# Patient Record
Sex: Female | Born: 1935 | Race: White | Hispanic: No | Marital: Single | Smoking: Never smoker
Health system: Southern US, Community
[De-identification: ages and names within clinical notes are randomized; demographics above are authoritative.]

## PROBLEM LIST (undated history)

## (undated) DIAGNOSIS — J302 Other seasonal allergic rhinitis: Secondary | ICD-10-CM

## (undated) DIAGNOSIS — E079 Disorder of thyroid, unspecified: Secondary | ICD-10-CM

## (undated) DIAGNOSIS — J45909 Unspecified asthma, uncomplicated: Secondary | ICD-10-CM

---

## 2002-02-11 ENCOUNTER — Encounter: Admission: RE | Admit: 2002-02-11 | Discharge: 2002-02-22 | Payer: Self-pay | Admitting: Neurology

## 2002-04-25 ENCOUNTER — Ambulatory Visit (HOSPITAL_BASED_OUTPATIENT_CLINIC_OR_DEPARTMENT_OTHER): Admission: RE | Admit: 2002-04-25 | Discharge: 2002-04-25 | Payer: Self-pay | Admitting: Plastic Surgery

## 2008-12-06 ENCOUNTER — Emergency Department (HOSPITAL_COMMUNITY): Admission: EM | Admit: 2008-12-06 | Discharge: 2008-12-06 | Payer: Self-pay | Admitting: Emergency Medicine

## 2011-09-23 LAB — POCT I-STAT, CHEM 8
Chloride: 109 mEq/L (ref 96–112)
Glucose, Bld: 133 mg/dL — ABNORMAL HIGH (ref 70–99)
HCT: 47 % — ABNORMAL HIGH (ref 36.0–46.0)
Hemoglobin: 16 g/dL — ABNORMAL HIGH (ref 12.0–15.0)
Potassium: 3.7 mEq/L (ref 3.5–5.1)

## 2012-10-18 ENCOUNTER — Emergency Department (INDEPENDENT_AMBULATORY_CARE_PROVIDER_SITE_OTHER)
Admission: EM | Admit: 2012-10-18 | Discharge: 2012-10-18 | Disposition: A | Discharge status: Home / Self Care | Payer: Medicare Other | Source: Home / Self Care | Attending: Emergency Medicine | Admitting: Emergency Medicine

## 2012-10-18 ENCOUNTER — Encounter (HOSPITAL_COMMUNITY): Payer: Self-pay | Admitting: Emergency Medicine

## 2012-10-18 DIAGNOSIS — G500 Trigeminal neuralgia: Secondary | ICD-10-CM

## 2012-10-18 HISTORY — DX: Disorder of thyroid, unspecified: E07.9

## 2012-10-18 HISTORY — DX: Unspecified asthma, uncomplicated: J45.909

## 2012-10-18 HISTORY — DX: Other seasonal allergic rhinitis: J30.2

## 2012-10-18 MED ORDER — TEGRETOL 200 MG PO TABS: 200.0000 mg | ORAL_TABLET | Freq: Two times a day (BID) | ORAL | Status: DC

## 2012-10-18 NOTE — ED Notes (Signed)
Ear pain since Tuesday 

## 2012-10-18 NOTE — ED Provider Notes (Signed)
Chief Complaint  Patient presents with  . Otalgia    History of Present Illness:   Vicki Livingston is an 78 year old female who presents today with left parietal head pain which has been going on for the past 3 days. The pain seems to originate behind the left ear and shoots to the left parietal area. She describes this as a sharp, stabbing, shooting pain lasting for just a second or 2. This will recur every 5-10 minutes.. Also feels that her ear is blocked up. She denies any drainage from the ear canal or difficulty hearing. She has had no rash in the area and denies any head injury or fall. She has had no diplopia, blurred vision or any other ocular symptoms. She denies nasal congestion, rhinorrhea, sore throat, or oral lesions. She's had no cervical pain, adenopathy, or swelling. She denies any paresthesias, muscle weakness, or difficulty with speech, swallowing, or ambulation. She's never had headaches before and has been remarkably healthy. She states she only sees a doctor about once a year for physical. She takes Synthroid, Premarin, and some allergy meds.  Review of Systems:  Other than noted above, the patient denies any of the following symptoms: Systemic:  No fever, chills, fatigue, photophobia, stiff neck. Eye:  No redness, eye pain, discharge, blurred vision, or diplopia. ENT:  No nasal congestion, rhinorrhea, sinus pressure or pain, sneezing, earache, or sore throat.  No jaw claudication. Neuro:  No paresthesias, loss of consciousness, seizure activity, muscle weakness, trouble with coordination or gait, trouble speaking or swallowing. Psych:  No depression, anxiety or trouble sleeping.  PMFSH:  Past medical history, family history, social history, meds, and allergies were reviewed.  Physical Exam:   Vital signs:  BP 140/81  Pulse 70  Temp 98.1 F (36.7 C) (Oral)  Resp 18  SpO2 94% General:  Alert and oriented.  In no distress. Eye:  Lids and conjunctivas normal.  PERRL at 3 mm  bilaterally,  Full EOMs.  Fundi benign with normal discs and vessels. ENT:  No cranial or facial tenderness to palpation.  TMs and canals clear.  Nasal mucosa was normal and uncongested without any drainage. No intra oral lesions, pharynx clear, mucous membranes moist, dentition normal. Neck:  Supple, full ROM, no tenderness to palpation.  No adenopathy or mass. Neuro:  Alert and orented times 3.  Speech was clear, fluent, and appropriate.  Cranial nerves intact. No pronator drift, muscle strength normal. Finger to nose normal.  DTRs were 2+ and symmetrical.Station and gait were normal.  Romberg's sign was normal.  Able to perform tandem gait well. Psych:  Normal affect.  Assessment:  The encounter diagnosis was Tic douloureux.  Plan:   1.  The following meds were prescribed:   New Prescriptions   TEGRETOL 200 MG TABLET    Take 1 tablet (200 mg total) by mouth 2 (two) times daily.   2.  The patient was instructed in symptomatic care and handouts were given. The diagnoses and cause of the pain was outlined with the patient. I suggested she followup with a neurologist as soon as possible and she was given the name of Dr. Porfirio Mylar Dohmeier. Side effects of Tegretol were outlined with her. If this is tic douloureux, the symptoms should go way fairly quickly. 3.  The patient was told to return if becoming worse in any way, if no better in 3 or 4 days, and given some red flag symptoms that would indicate earlier return.    Dineen Kid  Lorenz Coaster, MD 10/18/12 1327

## 2013-12-08 ENCOUNTER — Encounter (HOSPITAL_COMMUNITY): Payer: Self-pay | Admitting: Emergency Medicine

## 2013-12-08 ENCOUNTER — Emergency Department (HOSPITAL_COMMUNITY): Payer: Medicare Other

## 2013-12-08 ENCOUNTER — Emergency Department (INDEPENDENT_AMBULATORY_CARE_PROVIDER_SITE_OTHER)
Admission: EM | Admit: 2013-12-08 | Discharge: 2013-12-08 | Disposition: A | Discharge status: Nursing Home (Includes ICF) | Payer: Medicare Other | Source: Home / Self Care

## 2013-12-08 ENCOUNTER — Emergency Department (HOSPITAL_COMMUNITY)
Admission: EM | Admit: 2013-12-08 | Discharge: 2013-12-08 | Disposition: A | Discharge status: Home / Self Care | Payer: Medicare Other | Attending: Emergency Medicine | Admitting: Emergency Medicine

## 2013-12-08 DIAGNOSIS — R2990 Unspecified symptoms and signs involving the nervous system: Secondary | ICD-10-CM

## 2013-12-08 DIAGNOSIS — R519 Headache, unspecified: Secondary | ICD-10-CM

## 2013-12-08 DIAGNOSIS — J45909 Unspecified asthma, uncomplicated: Secondary | ICD-10-CM | POA: Insufficient documentation

## 2013-12-08 DIAGNOSIS — R51 Headache: Secondary | ICD-10-CM

## 2013-12-08 DIAGNOSIS — R29818 Other symptoms and signs involving the nervous system: Secondary | ICD-10-CM

## 2013-12-08 DIAGNOSIS — Z79899 Other long term (current) drug therapy: Secondary | ICD-10-CM | POA: Insufficient documentation

## 2013-12-08 DIAGNOSIS — E079 Disorder of thyroid, unspecified: Secondary | ICD-10-CM | POA: Insufficient documentation

## 2013-12-08 DIAGNOSIS — M531 Cervicobrachial syndrome: Secondary | ICD-10-CM | POA: Insufficient documentation

## 2013-12-08 DIAGNOSIS — M5481 Occipital neuralgia: Secondary | ICD-10-CM

## 2013-12-08 LAB — POCT I-STAT, CHEM 8
BUN: 21 mg/dL (ref 6–23)
Calcium, Ion: 1.17 mmol/L (ref 1.13–1.30)
Chloride: 104 mEq/L (ref 96–112)
HCT: 43 % (ref 36.0–46.0)
Hemoglobin: 14.6 g/dL (ref 12.0–15.0)
Potassium: 5.1 mEq/L (ref 3.5–5.1)
Sodium: 137 mEq/L (ref 135–145)

## 2013-12-08 LAB — CBC
HCT: 38.1 % (ref 36.0–46.0)
MCV: 86.8 fL (ref 78.0–100.0)
Platelets: 279 10*3/uL (ref 150–400)
RBC: 4.39 MIL/uL (ref 3.87–5.11)
RDW: 14 % (ref 11.5–15.5)
WBC: 7.1 10*3/uL (ref 4.0–10.5)

## 2013-12-08 MED ORDER — CARBAMAZEPINE 200 MG PO TABS: 200.0000 mg | ORAL_TABLET | Freq: Three times a day (TID) | ORAL | Status: DC

## 2013-12-08 MED ORDER — TRAMADOL HCL 50 MG PO TABS: 50.0000 mg | ORAL_TABLET | Freq: Four times a day (QID) | ORAL | Status: DC | PRN

## 2013-12-08 NOTE — ED Provider Notes (Signed)
CSN: [id number]     Arrival date & time 12/08/13  1029 History   None    Chief Complaint  Patient presents with  . Headache   (Consider location/radiation/quality/duration/timing/severity/associated sxs/prior Treatment) HPI Comments: 79 year old female presents complaining of severe pain in her head. She began having this pain on Wednesday. She has frequent sharp stabbing pains that originates in the right posterior superior neck and shoots up the back of her head. They last for a few seconds and then resolve. The pain is 10 out of 10. This happens every couple of minutes. She had this once approximately 15 years ago and was found to have MRI evidence of an old stroke at that time. He has a remote history of headaches and migraines and she says it is nothing like what she is feeling right now.  Patient is a 79 y.o. female presenting with headaches.  Headache Associated symptoms: no abdominal pain, no cough, no dizziness, no fever, no myalgias, no nausea and no vomiting     Past Medical History  Diagnosis Date  . Seasonal allergies   . Thyroid disease   . Asthma    History reviewed. No pertinent past surgical history. No family history on file. History  Substance Use Topics  . Smoking status: Never Smoker   . Smokeless tobacco: Not on file  . Alcohol Use: No   OB History   Grav Para Term Preterm Abortions TAB SAB Ect Mult Living                 Review of Systems  Constitutional: Negative for fever and chills.  Eyes: Negative for visual disturbance.  Respiratory: Negative for cough and shortness of breath.   Cardiovascular: Negative for chest pain, palpitations and leg swelling.  Gastrointestinal: Negative for nausea, vomiting and abdominal pain.  Endocrine: Negative for polydipsia and polyuria.  Genitourinary: Negative for dysuria, urgency and frequency.  Musculoskeletal: Negative for arthralgias and myalgias.  Skin: Negative for rash.  Neurological: Positive for  headaches. Negative for dizziness, weakness and light-headedness.    Allergies  Augmentin; Erythromycin; Levaquin; Macrolides and ketolides; and Sulfa antibiotics  Home Medications   Current Outpatient Rx  Name  Route  Sig  Dispense  Refill  . cetirizine (ZYRTEC) 10 MG tablet   Oral   Take 10 mg by mouth daily.         Marland Kitchen estrogens, conjugated, (PREMARIN) 0.625 MG tablet   Oral   Take 0.625 mg by mouth daily. Take daily for 21 days then do not take for 7 days.         Marland Kitchen levothyroxine (SYNTHROID, LEVOTHROID) 50 MCG tablet   Oral   Take 50 mcg by mouth daily.         . montelukast (SINGULAIR) 10 MG tablet   Oral   Take 10 mg by mouth at bedtime.         . TEGRETOL 200 MG tablet   Oral   Take 1 tablet (200 mg total) by mouth 2 (two) times daily.   60 tablet   0     Dispense as written.    BP 174/96  Pulse 81  Temp(Src) 98.4 F (36.9 C) (Oral)  Resp 18  SpO2 98% Physical Exam  Nursing note and vitals reviewed. Constitutional: She is oriented to person, place, and time. Vital signs are normal. She appears well-developed and well-nourished. No distress.  HENT:  Head: Normocephalic and atraumatic.  Pulmonary/Chest: Effort normal. No respiratory distress.  Neurological: She is alert and oriented to person, place, and time. She has normal strength. A cranial nerve deficit is present. Coordination normal.  EOM weakness with superior left lateral gaze   Skin: Skin is warm and dry. No rash noted. She is not diaphoretic.  Psychiatric: She has a normal mood and affect. Judgment normal.    ED Course  Procedures (including critical care time) Labs Review Labs Reviewed - No data to display Imaging Review No results found.    MDM   1. Headache   2. Abnormal neurological exam    Transferred to ED for eval of severe headaches, r/o CVA, r/o intracranial abnormality     Graylon Good, PA-C 12/08/13 1101

## 2013-12-08 NOTE — ED Provider Notes (Signed)
CSN: [id number]     Arrival date & time 12/08/13  1111 History   First MD Initiated Contact with Patient 12/08/13 1129     Chief Complaint  Patient presents with  . Headache   (Consider location/radiation/quality/duration/timing/severity/associated sxs/prior Treatment) HPI Comments: Patient presents to the ER for evaluation of headaches. Patient reports that she has been experiencing sharp, stabbing pains in the right occipital area of her head for the last 4 days. Symptoms have been intermittently. In between episodes, she is completely normal. She reports that she will have a stabbing pain that lasts for seconds and then resolves. She does have a history of intermittent headaches and migraines, this feels different. She reports that it doesn't feel like a migraine, cannot describe what she is experiencing. No fever, neck pain. Pain does not occur with movement of the hand. She has not identified any exacerbating or alleviating factors.  Patient is a 79 y.o. female presenting with headaches.  Headache   Past Medical History  Diagnosis Date  . Seasonal allergies   . Thyroid disease   . Asthma    History reviewed. No pertinent past surgical history. No family history on file. History  Substance Use Topics  . Smoking status: Never Smoker   . Smokeless tobacco: Not on file  . Alcohol Use: No   OB History   Grav Para Term Preterm Abortions TAB SAB Ect Mult Living                 Review of Systems  Neurological: Positive for headaches.  All other systems reviewed and are negative.    Allergies  Augmentin; Erythromycin; Levaquin; Macrolides and ketolides; and Sulfa antibiotics  Home Medications   Current Outpatient Rx  Name  Route  Sig  Dispense  Refill  . cetirizine (ZYRTEC) 10 MG tablet   Oral   Take 10 mg by mouth daily.         Marland Kitchen estrogens, conjugated, (PREMARIN) 0.625 MG tablet   Oral   Take 0.625 mg by mouth daily. Take daily for 21 days then do not take for 7  days.         Marland Kitchen levothyroxine (SYNTHROID, LEVOTHROID) 50 MCG tablet   Oral   Take 50 mcg by mouth daily.         . montelukast (SINGULAIR) 10 MG tablet   Oral   Take 10 mg by mouth at bedtime.         . TEGRETOL 200 MG tablet   Oral   Take 1 tablet (200 mg total) by mouth 2 (two) times daily.   60 tablet   0     Dispense as written.    BP 145/75  Pulse 77  Temp(Src) 97.5 F (36.4 C) (Oral)  Resp 16  Wt 135 lb 14.4 oz (61.644 kg)  SpO2 98% Physical Exam  Constitutional: She is oriented to person, place, and time. She appears well-developed and well-nourished. No distress.  HENT:  Head: Normocephalic and atraumatic.  Right Ear: Hearing normal.  Left Ear: Hearing normal.  Nose: Nose normal.  Mouth/Throat: Oropharynx is clear and moist and mucous membranes are normal.  Eyes: Conjunctivae and EOM are normal. Pupils are equal, round, and reactive to light.  Neck: Normal range of motion. Neck supple.  Cardiovascular: Regular rhythm, S1 normal and S2 normal.  Exam reveals no gallop and no friction rub.   No murmur heard. Pulmonary/Chest: Effort normal and breath sounds normal. No respiratory distress. She exhibits no tenderness.  Abdominal: Soft. Normal appearance and bowel sounds are normal. There is no hepatosplenomegaly. There is no tenderness. There is no rebound, no guarding, no tenderness at McBurney's point and negative Murphy's sign. No hernia.  Musculoskeletal: Normal range of motion.  Neurological: She is alert and oriented to person, place, and time. She has normal strength. No cranial nerve deficit or sensory deficit. Coordination normal. GCS eye subscore is 4. GCS verbal subscore is 5. GCS motor subscore is 6.  Skin: Skin is warm, dry and intact. No rash noted. No cyanosis.  Psychiatric: She has a normal mood and affect. Her speech is normal and behavior is normal. Thought content normal.    ED Course  Procedures (including critical care time) Labs  Review Labs Reviewed  CBC   Imaging Review No results found.  EKG Interpretation   None       MDM  Diagnosis: Occipital neuralgia  Patient presents to ER for evaluation of paroxysmal, sharp, stabbing pains over the distribution of the occipital nerve on the right side for several days. In between episodes, patient is entirely symptom-free. She does have a history of headaches and migraines, but this is different. Workup was unremarkable including CT of head. She has a normal neurologic exam. She was referred to the ER by her care because they were concerned about possible cranial nerve abnormality. My examination here is normal, I do not see any evidence of cranial nerve palsy or abnormality of extraocular muscle movements. Pain she is experiencing would not be present in any pathology that would cause cranial nerve palsy.  As her workup is entirely normal, patient will be treated empirically for occipital neuralgia, refer to urology. Return to the ER if symptoms worsen.    Gilda Crease, MD 12/08/13 1308

## 2013-12-08 NOTE — ED Notes (Signed)
Pt c/o severe head pain onset Wednesday. Pt seen at urgent care and sent here for further eval. Pt describes pain as a feeling of lightening. Pain is intermittent. Pt tried tylenol without relief. Pt denies weakness. Pt with equal grips B/L, speech clear, no facial droop. Pt has history of stroke.

## 2013-12-08 NOTE — ED Notes (Signed)
Patient transported to CT 

## 2013-12-08 NOTE — ED Notes (Signed)
Patient returned from CT

## 2013-12-08 NOTE — ED Notes (Signed)
79 yr old is here today with complaints of a headache for 4 dys. Pt does have Hx of Stroke. Denies: SOB, chest pain

## 2013-12-09 NOTE — ED Provider Notes (Signed)
Medical screening examination/treatment/procedure(s) were performed by a resident physician or non-physician practitioner and as the supervising physician I was immediately available for consultation/collaboration.  Evan Corey, MD    Evan S Corey, MD 12/09/13 0749 

## 2013-12-14 ENCOUNTER — Emergency Department (HOSPITAL_COMMUNITY): Payer: Medicare Other

## 2013-12-14 ENCOUNTER — Emergency Department (HOSPITAL_COMMUNITY)
Admission: EM | Admit: 2013-12-14 | Discharge: 2013-12-14 | Disposition: A | Discharge status: Home / Self Care | Payer: Medicare Other | Attending: Emergency Medicine | Admitting: Emergency Medicine

## 2013-12-14 ENCOUNTER — Encounter (HOSPITAL_COMMUNITY): Payer: Self-pay | Admitting: Emergency Medicine

## 2013-12-14 DIAGNOSIS — Z79899 Other long term (current) drug therapy: Secondary | ICD-10-CM | POA: Insufficient documentation

## 2013-12-14 DIAGNOSIS — S20212A Contusion of left front wall of thorax, initial encounter: Secondary | ICD-10-CM

## 2013-12-14 DIAGNOSIS — Z880 Allergy status to penicillin: Secondary | ICD-10-CM | POA: Insufficient documentation

## 2013-12-14 DIAGNOSIS — Y939 Activity, unspecified: Secondary | ICD-10-CM | POA: Insufficient documentation

## 2013-12-14 DIAGNOSIS — Z791 Long term (current) use of non-steroidal anti-inflammatories (NSAID): Secondary | ICD-10-CM | POA: Insufficient documentation

## 2013-12-14 DIAGNOSIS — IMO0002 Reserved for concepts with insufficient information to code with codable children: Secondary | ICD-10-CM | POA: Insufficient documentation

## 2013-12-14 DIAGNOSIS — W19XXXA Unspecified fall, initial encounter: Secondary | ICD-10-CM

## 2013-12-14 DIAGNOSIS — W1809XA Striking against other object with subsequent fall, initial encounter: Secondary | ICD-10-CM | POA: Insufficient documentation

## 2013-12-14 DIAGNOSIS — E079 Disorder of thyroid, unspecified: Secondary | ICD-10-CM | POA: Insufficient documentation

## 2013-12-14 DIAGNOSIS — Y92009 Unspecified place in unspecified non-institutional (private) residence as the place of occurrence of the external cause: Secondary | ICD-10-CM | POA: Insufficient documentation

## 2013-12-14 DIAGNOSIS — S20219A Contusion of unspecified front wall of thorax, initial encounter: Secondary | ICD-10-CM | POA: Insufficient documentation

## 2013-12-14 DIAGNOSIS — J45909 Unspecified asthma, uncomplicated: Secondary | ICD-10-CM | POA: Insufficient documentation

## 2013-12-14 LAB — URINALYSIS, ROUTINE W REFLEX MICROSCOPIC
Glucose, UA: NEGATIVE mg/dL
Ketones, ur: 15 mg/dL — AB
Leukocytes, UA: NEGATIVE
Nitrite: NEGATIVE
Specific Gravity, Urine: 1.017 (ref 1.005–1.030)
pH: 6 (ref 5.0–8.0)

## 2013-12-14 MED ORDER — HYDROCODONE-ACETAMINOPHEN 5-325 MG PO TABS: 1.0000 | ORAL_TABLET | Freq: Four times a day (QID) | ORAL | Status: AC | PRN

## 2013-12-14 MED ORDER — ONDANSETRON HCL 4 MG PO TABS: 4.0000 mg | ORAL_TABLET | Freq: Four times a day (QID) | ORAL | Status: AC

## 2013-12-14 MED ORDER — HYDROCODONE-ACETAMINOPHEN 5-325 MG PO TABS
1.0000 | ORAL_TABLET | Freq: Once | ORAL | Status: DC
  Filled 2013-12-14: qty 1

## 2013-12-14 NOTE — ED Provider Notes (Signed)
Medical screening examination/treatment/procedure(s) were conducted as a shared visit with non-physician practitioner(s) and myself.  I personally evaluated the patient during the encounter.  EKG Interpretation   None      Tender left lateral lower ribs abd:SNT left hip NT  Hurman Horn, MD 12/16/13 2032

## 2013-12-14 NOTE — ED Notes (Addendum)
Pt states she thinks her feet got caught on the carpet and she fell.  Pt c/o L rib pain.  Pt denies striking head.

## 2013-12-14 NOTE — ED Provider Notes (Signed)
CSN: [id number]     Arrival date & time 12/14/13  1413 History  This chart was scribed for non-physician practitioner Allean Found, PA-C working with Hurman Horn, MD by Valera Castle, ED scribe. This patient was seen in room TR09C/TR09C and the patient's care was started at 4:55 PM.   Chief Complaint  Patient presents with  . Fall   HPI HPI Comments: Vicki Livingston is a 80 y.o. female who presents to the Emergency Department complaining of a fall, onset 3 days ago, when she fell backwards onto a plastic bottle with a large cap. She reports sudden, moderate, constant, upper, left sided back pain, that radiates to her left rib, from the fall. She denies LOC, head trauma. She reports pain with movement, including when she attempts to lay down. She reports she has had Vicodin in the past for pain. She denies middle back pain, abdominal pain, hip pain, dizziness, light-headedness, nausea, hematuria, and any other associated symptoms. Pt denies any pertinent medical history.   PCP - No primary provider on file.  Past Medical History  Diagnosis Date  . Seasonal allergies   . Thyroid disease   . Asthma    History reviewed. No pertinent past surgical history. History reviewed. No pertinent family history. History  Substance Use Topics  . Smoking status: Never Smoker   . Smokeless tobacco: Not on file  . Alcohol Use: No   OB History   Grav Para Term Preterm Abortions TAB SAB Ect Mult Living                 Review of Systems  Gastrointestinal: Negative for nausea and abdominal pain.  Genitourinary: Negative for hematuria.  Musculoskeletal: Positive for arthralgias (left rib) and back pain (left, upper). Negative for gait problem.  Neurological: Negative for dizziness, syncope and light-headedness.  All other systems reviewed and are negative.    Allergies  Augmentin; Erythromycin; Levaquin; Macrolides and ketolides; and Sulfa antibiotics  Home Medications   Current Outpatient Rx   Name  Route  Sig  Dispense  Refill  . acetaminophen (TYLENOL) 500 MG tablet   Oral   Take 1,000 mg by mouth every 6 (six) hours as needed for mild pain.         . carbamazepine (TEGRETOL) 200 MG tablet   Oral   Take 1 tablet (200 mg total) by mouth 3 (three) times daily. Take one half a tablet twice a day for two days, then take one tablet twice a day for two days, then take 2 tablets a day after that   30 tablet   2   . cetirizine (ZYRTEC) 10 MG tablet   Oral   Take 10 mg by mouth daily.         Marland Kitchen estrogens, conjugated, (PREMARIN) 0.625 MG tablet   Oral   Take 0.625 mg by mouth daily. Take daily for 21 days then do not take for 7 days.         Marland Kitchen ibuprofen (ADVIL,MOTRIN) 200 MG tablet   Oral   Take 200 mg by mouth every 6 (six) hours as needed for moderate pain.         Marland Kitchen levothyroxine (SYNTHROID, LEVOTHROID) 50 MCG tablet   Oral   Take 50 mcg by mouth daily.         . montelukast (SINGULAIR) 10 MG tablet   Oral   Take 10 mg by mouth at bedtime.         . naproxen sodium (  ANAPROX) 220 MG tablet   Oral   Take 220 mg by mouth 2 (two) times daily as needed (for pain).         . traMADol (ULTRAM) 50 MG tablet   Oral   Take 1 tablet (50 mg total) by mouth every 6 (six) hours as needed.   15 tablet   0    BP 170/81  Pulse 82  Temp(Src) 97.5 F (36.4 C) (Oral)  Resp 16  Ht 5\' 4"  (1.626 m)  Wt 134 lb 6.4 oz (60.963 kg)  BMI 23.06 kg/m2  SpO2 95%  Physical Exam  Nursing note and vitals reviewed. Constitutional: She is oriented to person, place, and time. She appears well-developed and well-nourished. No distress.  HENT:  Head: Normocephalic and atraumatic.  Eyes: EOM are normal.  Neck: Neck supple. No tracheal deviation present.  Cardiovascular: Normal rate.   Pulmonary/Chest: Effort normal. No respiratory distress.  Musculoskeletal: Normal range of motion.  Neurological: She is alert and oriented to person, place, and time.  Skin: Skin is warm  and dry.  Psychiatric: She has a normal mood and affect. Her behavior is normal.    ED Course  Procedures (including critical care time)  DIAGNOSTIC STUDIES: Oxygen Saturation is 95% on room air, normal by my interpretation.    COORDINATION OF CARE: 5:00 PM-Discussed treatment plan which includes imaging results and UA with pt at bedside and pt agreed to plan.   Labs Review Labs Reviewed - No data to display Imaging Review Dg Ribs Unilateral W/chest Left  12/14/2013   CLINICAL DATA:  79 year old who fell 3 days ago and injured the left posterior lower ribs.  EXAM: LEFT RIBS AND CHEST - 3+ VIEW  COMPARISON:  None.  FINDINGS: No fractures identified involving the left ribs. Osseous demineralization. Prominent costal cartilage calcification.  Cardiac silhouette normal in size. Thoracic aorta mildly atherosclerotic. Hilar and mediastinal contours otherwise unremarkable. Lungs clear. Bronchovascular markings normal. Pulmonary vascularity normal. No visible pleural effusions. No pneumothorax. Mild elevation of the right hemidiaphragm.  IMPRESSION: 1. No left rib fractures identified. 2. No acute cardiopulmonary disease.   Electronically Signed   By: Hulan Saas M.D.   On: 12/14/2013 15:44    EKG Interpretation   None       MDM  No diagnosis found. 1. Rib contusion 2. Fall  Urine negative for hemoglobin. No abdominal tenderness - doubt splenic injury. X-ray reassuring for no pulmonary injury. Patient stable for discharge.      I personally performed the services described in this documentation, which was scribed in my presence. The recorded information has been reviewed and is accurate.     Arnoldo Hooker, PA-C 12/14/13 [phone]

## 2013-12-14 NOTE — ED Notes (Signed)
Vicki Beam, PA notified that pt will not take generic meds because she states they make her vomit.  Pharmacy called to inquire if we had non-generic Norco or Tramadol.  We only have generic.

## 2013-12-14 NOTE — ED Notes (Signed)
Per pt sts fell on [place] and hit left upper back area and is in pain. Denies LOC or hitting head. Pt points to left rib area.

## 2014-04-28 ENCOUNTER — Encounter (HOSPITAL_COMMUNITY): Payer: Self-pay | Admitting: Emergency Medicine

## 2014-04-28 ENCOUNTER — Emergency Department (INDEPENDENT_AMBULATORY_CARE_PROVIDER_SITE_OTHER)
Admission: EM | Admit: 2014-04-28 | Discharge: 2014-04-28 | Disposition: A | Discharge status: Home / Self Care | Payer: Medicare Other | Source: Home / Self Care | Attending: Family Medicine | Admitting: Family Medicine

## 2014-04-28 DIAGNOSIS — N390 Urinary tract infection, site not specified: Secondary | ICD-10-CM

## 2014-04-28 LAB — POCT URINALYSIS DIP (DEVICE)
BILIRUBIN URINE: NEGATIVE
Glucose, UA: NEGATIVE mg/dL
HGB URINE DIPSTICK: NEGATIVE
Nitrite: NEGATIVE
PH: 6.5 (ref 5.0–8.0)
Protein, ur: NEGATIVE mg/dL
Specific Gravity, Urine: 1.01 (ref 1.005–1.030)
UROBILINOGEN UA: 0.2 mg/dL (ref 0.0–1.0)

## 2014-04-28 MED ORDER — SUPRAX 400 MG PO CAPS: 400.0000 mg | ORAL_CAPSULE | Freq: Every day | ORAL | Status: AC

## 2014-04-28 NOTE — ED Provider Notes (Signed)
Medical screening examination/treatment/procedure(s) were performed by resident physician or non-physician practitioner and as supervising physician I was immediately available for consultation/collaboration.   Yamel Bale DOUGLAS MD.   Surina Storts D Puanani Gene, MD 04/28/14 2158 

## 2014-04-28 NOTE — Discharge Instructions (Signed)
Urinary Tract Infection  Urinary tract infections (UTIs) can develop anywhere along your urinary tract. Your urinary tract is your body's drainage system for removing wastes and extra water. Your urinary tract includes two kidneys, two ureters, a bladder, and a urethra. Your kidneys are a pair of bean-shaped organs. Each kidney is about the size of your fist. They are located below your ribs, one on each side of your spine.  CAUSES  Infections are caused by microbes, which are microscopic organisms, including fungi, viruses, and bacteria. These organisms are so small that they can only be seen through a microscope. Bacteria are the microbes that most commonly cause UTIs.  SYMPTOMS   Symptoms of UTIs may vary by age and gender of the patient and by the location of the infection. Symptoms in young women typically include a frequent and intense urge to urinate and a painful, burning feeling in the bladder or urethra during urination. Older women and men are more likely to be tired, shaky, and weak and have muscle aches and abdominal pain. A fever may mean the infection is in your kidneys. Other symptoms of a kidney infection include pain in your back or sides below the ribs, nausea, and vomiting.  DIAGNOSIS  To diagnose a UTI, your caregiver will ask you about your symptoms. Your caregiver also will ask to provide a urine sample. The urine sample will be tested for bacteria and white blood cells. White blood cells are made by your body to help fight infection.  TREATMENT   Typically, UTIs can be treated with medication. Because most UTIs are caused by a bacterial infection, they usually can be treated with the use of antibiotics. The choice of antibiotic and length of treatment depend on your symptoms and the type of bacteria causing your infection.  HOME CARE INSTRUCTIONS   If you were prescribed antibiotics, take them exactly as your caregiver instructs you. Finish the medication even if you feel better after you  have only taken some of the medication.   Drink enough water and fluids to keep your urine clear or pale yellow.   Avoid caffeine, tea, and carbonated beverages. They tend to irritate your bladder.   Empty your bladder often. Avoid holding urine for long periods of time.   Empty your bladder before and after sexual intercourse.   After a bowel movement, women should cleanse from front to back. Use each tissue only once.  SEEK MEDICAL CARE IF:    You have back pain.   You develop a fever.   Your symptoms do not begin to resolve within 3 days.  SEEK IMMEDIATE MEDICAL CARE IF:    You have severe back pain or lower abdominal pain.   You develop chills.   You have nausea or vomiting.   You have continued burning or discomfort with urination.  MAKE SURE YOU:    Understand these instructions.   Will watch your condition.   Will get help right away if you are not doing well or get worse.  Document Released: 09/14/2005 Document Revised: 06/05/2012 Document Reviewed: 01/13/2012  [employer] Patient Information 2014 [employer]

## 2014-04-28 NOTE — ED Provider Notes (Signed)
CSN: [id number]     Arrival date & time 04/28/14  1619 History   None    Chief Complaint  Patient presents with  . Urinary Tract Infection   (Consider location/radiation/quality/duration/timing/severity/associated sxs/prior Treatment) HPI Comments: 80 year old female presents complaining of possible urinary tract infection. For a few days she has burning with urination, urinary frequency, and just today some lower abdominal pain radiating to the lower back with urination. Her symptoms have gotten slightly better with over-the-counter AZO but overall had been getting persistently worse. She denies any fever, chills, NVD, flank pain. No recent hospitalizations or catheterizations   Past Medical History  Diagnosis Date  . Seasonal allergies   . Thyroid disease   . Asthma    History reviewed. No pertinent past surgical history. History reviewed. No pertinent family history. History  Substance Use Topics  . Smoking status: Never Smoker   . Smokeless tobacco: Not on file  . Alcohol Use: No   OB History   Grav Para Term Preterm Abortions TAB SAB Ect Mult Living                 Review of Systems  Genitourinary: Positive for dysuria, urgency and frequency. Negative for flank pain and vaginal discharge.  All other systems reviewed and are negative.   Allergies  Augmentin; Erythromycin; Levaquin; Macrolides and ketolides; and Sulfa antibiotics  Home Medications   Prior to Admission medications   Medication Sig Start Date End Date Taking? Authorizing Provider  cetirizine (ZYRTEC) 10 MG tablet Take 10 mg by mouth daily.    Historical Provider, MD  estrogens, conjugated, (PREMARIN) 0.625 MG tablet Take 0.625 mg by mouth daily. Take daily for 21 days then do not take for 7 days.    Historical Provider, MD  HYDROcodone-acetaminophen (NORCO) 5-325 MG per tablet Take 1 tablet by mouth every 6 (six) hours as needed for moderate pain. 12/14/13   Shari A Upstill, PA-C  ibuprofen  (ADVIL,MOTRIN) 200 MG tablet Take 200 mg by mouth every 6 (six) hours as needed for moderate pain.    Historical Provider, MD  levothyroxine (SYNTHROID, LEVOTHROID) 50 MCG tablet Take 50 mcg by mouth daily.    Historical Provider, MD  montelukast (SINGULAIR) 10 MG tablet Take 10 mg by mouth at bedtime.    Historical Provider, MD  ondansetron (ZOFRAN) 4 MG tablet Take 1 tablet (4 mg total) by mouth every 6 (six) hours. 12/14/13   Shari A Upstill, PA-C   BP 158/79  Pulse 90  Temp(Src) 98.1 F (36.7 C) (Oral)  Resp 16  SpO2 98% Physical Exam  Nursing note and vitals reviewed. Constitutional: She is oriented to person, place, and time. Vital signs are normal. She appears well-developed and well-nourished. No distress.  HENT:  Head: Normocephalic and atraumatic.  Pulmonary/Chest: Effort normal. No respiratory distress.  Abdominal: Soft. She exhibits no mass. There is no tenderness. There is no rebound, no guarding and no CVA tenderness.  Neurological: She is alert and oriented to person, place, and time. She has normal strength. Coordination normal.  Skin: Skin is warm and dry. No rash noted. She is not diaphoretic.  Psychiatric: She has a normal mood and affect. Judgment normal.    ED Course  Procedures (including critical care time) Labs Review Labs Reviewed  POCT URINALYSIS DIP (DEVICE) - Abnormal; Notable for the following:    Ketones, ur TRACE (*)    Leukocytes, UA TRACE (*)    All other components within normal limits  URINE CULTURE  Imaging Review No results found.   MDM   1. UTI (lower urinary tract infection)    She requests name brand treatment only.  She has many drug allergies.  Will treat with suprax.  Push fluids.  F/u PRN.  Culture sent    Meds ordered this encounter  Medications  . SUPRAX 400 MG CAPS capsule    Sig: Take 1 capsule (400 mg total) by mouth daily.    Dispense:  7 capsule    Refill:  0    Order Specific Question:  Supervising Provider     Answer:  Clementeen GrahamOREY, EVAN, Kathie RhodesS [[id number]]       Graylon GoodZachary H Idara Woodside, PA-C 04/28/14 [id number]

## 2014-04-28 NOTE — ED Notes (Signed)
C/o  Urinary urgency and frequency.  Dysuria.  X 1 wk.  Mild relief with AZO.

## 2014-05-05 ENCOUNTER — Telehealth (HOSPITAL_COMMUNITY): Payer: Self-pay | Admitting: *Deleted

## 2014-05-05 NOTE — ED Notes (Signed)
Urine culture was cancelled because the specimen was not received.  Pt. treated with Suprax. 5/14 Message sent to Borders Groupach Baker PA.  He wrote to call pt. for clinical improvement.  5/18 I called pt. and she said no pain or problem urinating. I told her to come back if not better after finishing her medication. Pt. voiced understanding. Vicki Livingston 05/05/2014

## 2014-05-23 IMAGING — CR DG RIBS W/ CHEST 3+V*L*
5 series · 5 of 5 positions shown · non-contrast
Comparison: None.

CLINICAL DATA: 83-year-old who fell 3 days ago and injured the left
posterior lower ribs.

EXAM:
LEFT RIBS AND CHEST - 3+ VIEW

[w chest pa]
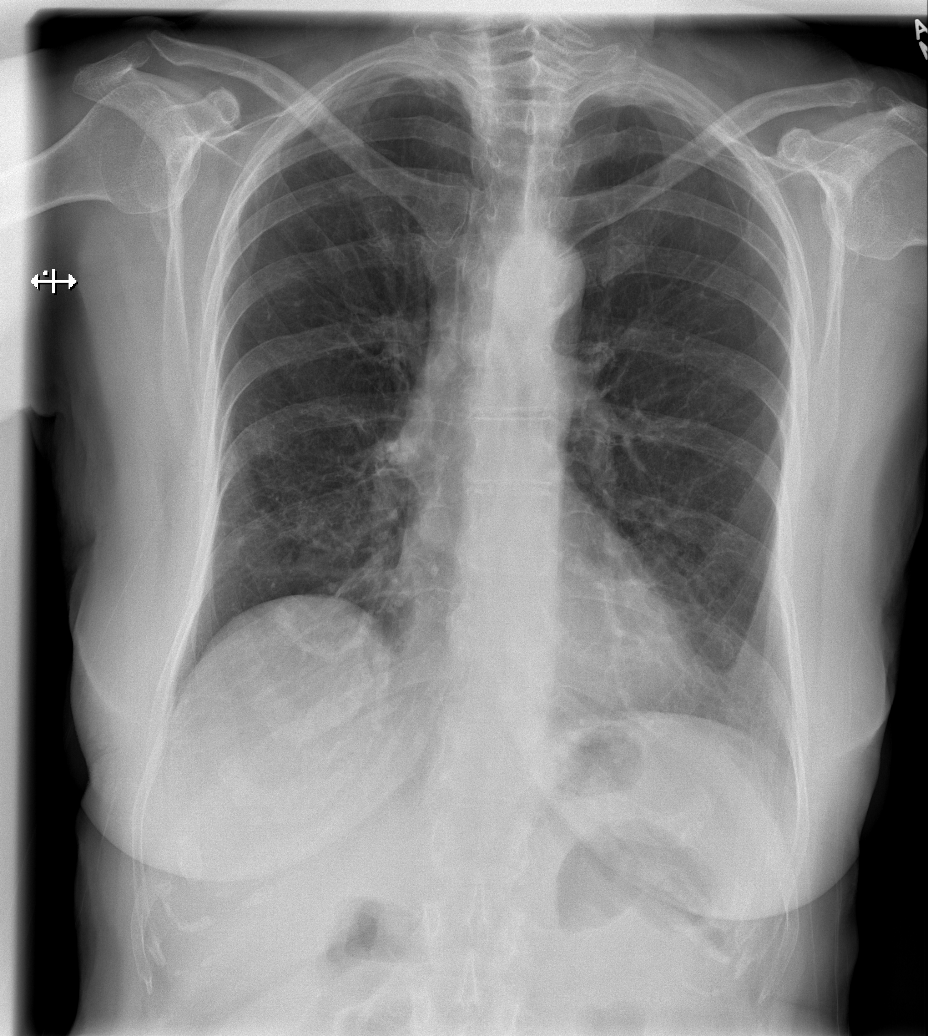

[w ribs ap upper left]
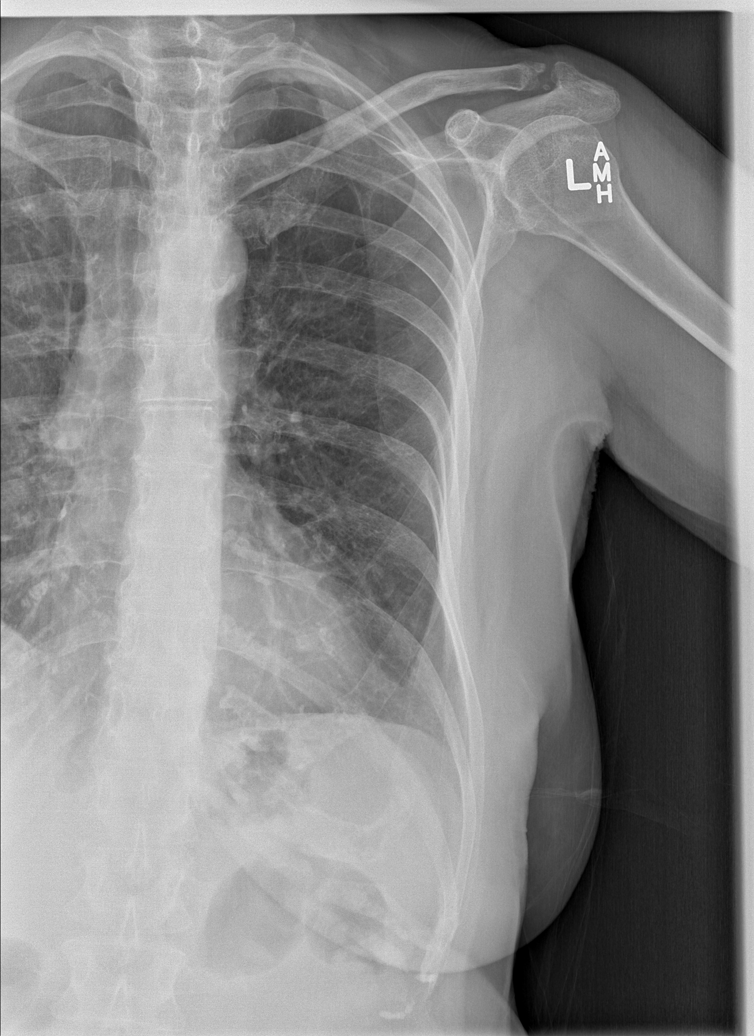

[w ribs ap lower left]
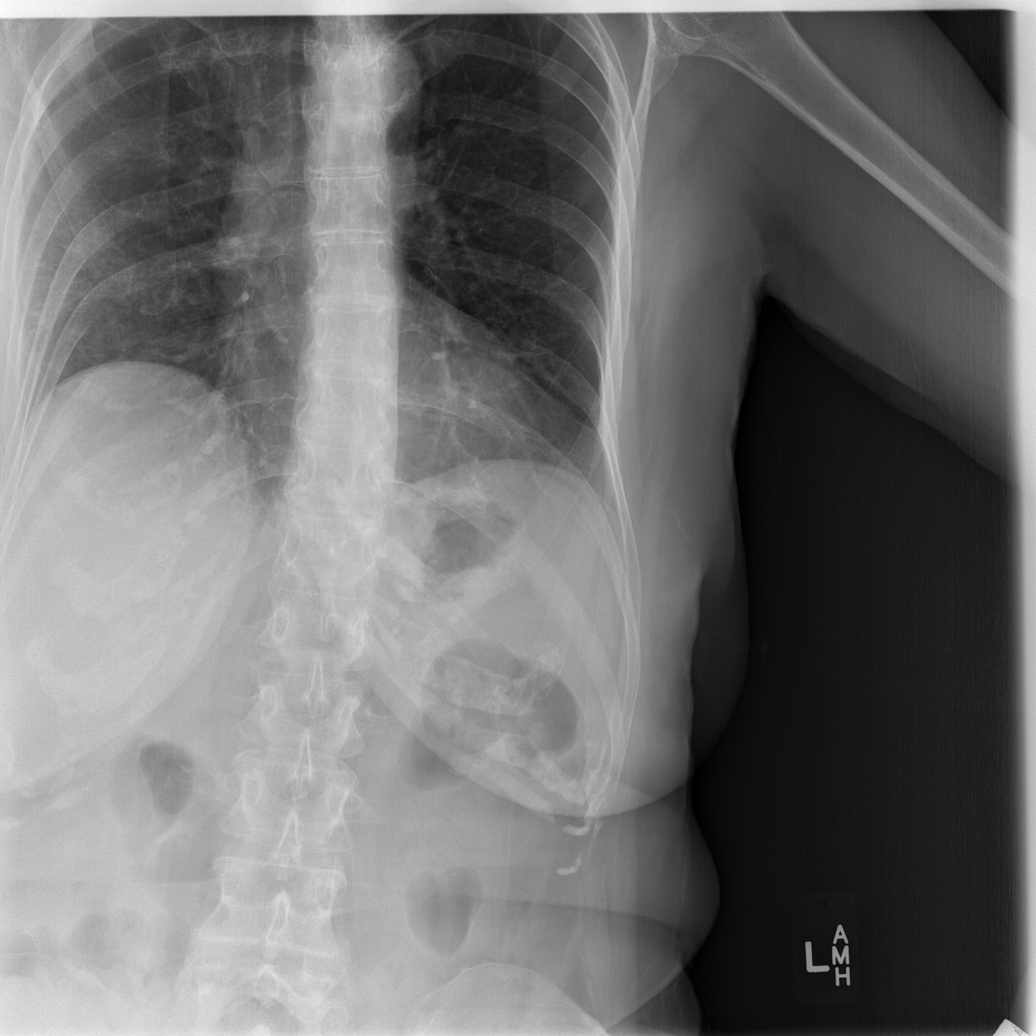

[w ribs obl left (1 of 2)]
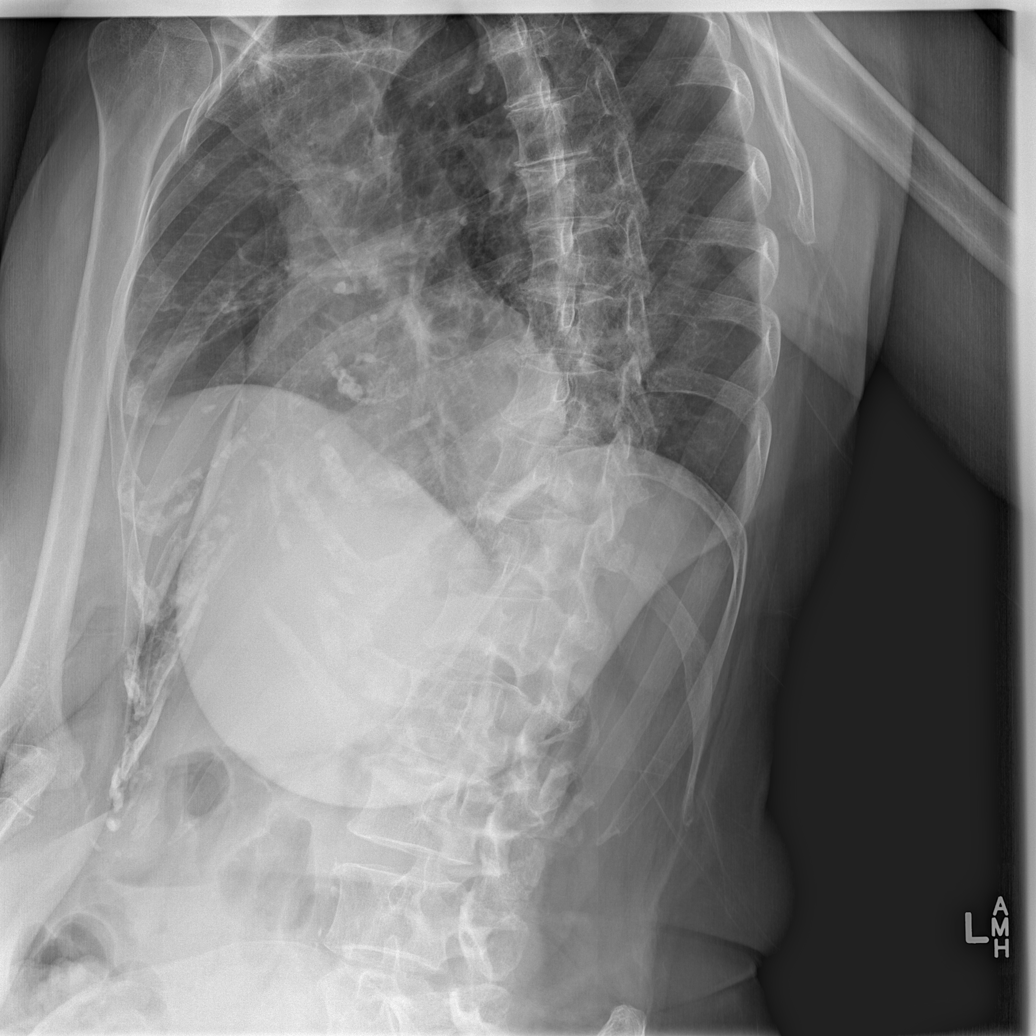

[w ribs obl left (2 of 2)]
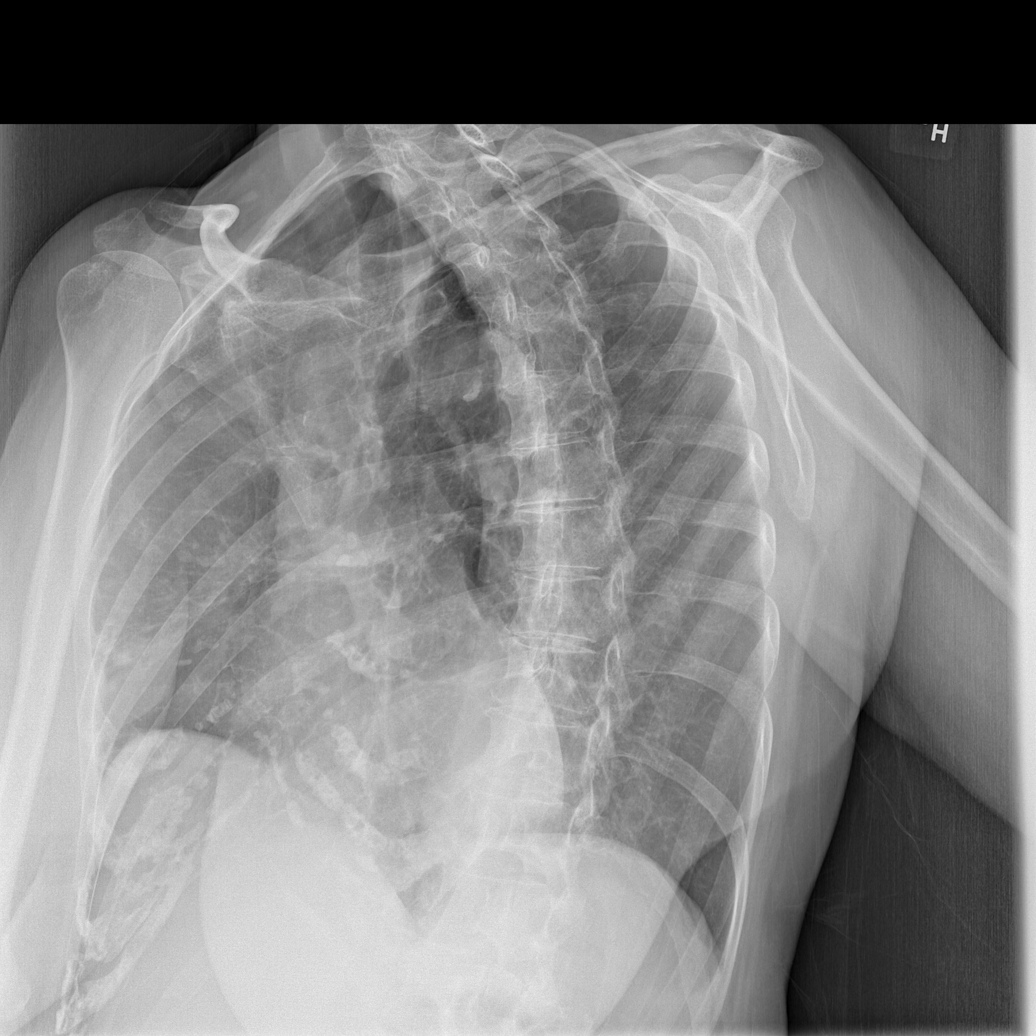

[5 of 5 positions shown; findings below may reference images not displayed]

FINDINGS: No fractures identified involving the left ribs. Osseous
demineralization. Prominent costal cartilage calcification.

Cardiac silhouette normal in size. Thoracic aorta mildly
atherosclerotic. Hilar and mediastinal contours otherwise
unremarkable. Lungs clear. Bronchovascular markings normal.
Pulmonary vascularity normal. No visible pleural effusions. No
pneumothorax. Mild elevation of the right hemidiaphragm.
IMPRESSION: 1. No left rib fractures identified.
2. No acute cardiopulmonary disease.

## 2014-10-11 ENCOUNTER — Emergency Department (INDEPENDENT_AMBULATORY_CARE_PROVIDER_SITE_OTHER)
Admission: EM | Admit: 2014-10-11 | Discharge: 2014-10-11 | Disposition: A | Discharge status: Home / Self Care | Payer: Medicare Other | Source: Home / Self Care | Attending: Family Medicine | Admitting: Family Medicine

## 2014-10-11 ENCOUNTER — Encounter (HOSPITAL_COMMUNITY): Payer: Self-pay | Admitting: Emergency Medicine

## 2014-10-11 DIAGNOSIS — J029 Acute pharyngitis, unspecified: Secondary | ICD-10-CM

## 2014-10-11 LAB — TSH: TSH: 0.383 u[IU]/mL (ref 0.350–4.500)

## 2014-10-11 LAB — POCT RAPID STREP A: Streptococcus, Group A Screen (Direct): NEGATIVE

## 2014-10-11 MED ORDER — NORCO 5-325 MG PO TABS: 0.5000 | ORAL_TABLET | Freq: Four times a day (QID) | ORAL | Status: AC | PRN

## 2014-10-11 NOTE — ED Provider Notes (Signed)
CSN: [id number]     Arrival date & time 10/11/14  0910 History   First MD Initiated Contact with Patient 10/11/14 [phone]     Chief Complaint  Patient presents with  . Sore Throat   (Consider location/radiation/quality/duration/timing/severity/associated sxs/prior Treatment) HPI     80 year old female presents complaining of sore throat. She has had a sore throat for about 3 days on the right side of her throat. The right side of her neck is tender to touch as well. She has pain with swallowing, and today her voice is hoarse. She denies any other associated symptoms including no fever, chills, NVD, cough, rash. No recent travel or sick contacts. She tried taking one Advil one time which did not help.  Past Medical History  Diagnosis Date  . Seasonal allergies   . Thyroid disease   . Asthma    History reviewed. No pertinent past surgical history. No family history on file. History  Substance Use Topics  . Smoking status: Never Smoker   . Smokeless tobacco: Not on file  . Alcohol Use: No   OB History   Grav Para Term Preterm Abortions TAB SAB Ect Mult Living                 Review of Systems  HENT: Positive for sore throat.   All other systems reviewed and are negative.   Allergies  Augmentin; Erythromycin; Levaquin; Macrolides and ketolides; and Sulfa antibiotics  Home Medications   Prior to Admission medications   Medication Sig Start Date End Date Taking? Authorizing Provider  cetirizine (ZYRTEC) 10 MG tablet Take 10 mg by mouth daily.   Yes Historical Provider, MD  levothyroxine (SYNTHROID, LEVOTHROID) 50 MCG tablet Take 50 mcg by mouth daily.   Yes Historical Provider, MD  montelukast (SINGULAIR) 10 MG tablet Take 10 mg by mouth at bedtime.   Yes Historical Provider, MD  estrogens, conjugated, (PREMARIN) 0.625 MG tablet Take 0.625 mg by mouth daily. Take daily for 21 days then do not take for 7 days.    Historical Provider, MD  HYDROcodone-acetaminophen (NORCO) 5-325 MG  per tablet Take 1 tablet by mouth every 6 (six) hours as needed for moderate pain. 12/14/13   Shari A Upstill, PA-C  ibuprofen (ADVIL,MOTRIN) 200 MG tablet Take 200 mg by mouth every 6 (six) hours as needed for moderate pain.    Historical Provider, MD  NORCO 5-325 MG per tablet Take 0.5-1 tablets by mouth every 6 (six) hours as needed for moderate pain. 10/11/14   Adrian BlackwaterZachary H Prajna Vanderpool, PA-C  ondansetron (ZOFRAN) 4 MG tablet Take 1 tablet (4 mg total) by mouth every 6 (six) hours. 12/14/13   Shari A Upstill, PA-C  SUPRAX 400 MG CAPS capsule Take 1 capsule (400 mg total) by mouth daily. 04/28/14   Adrian BlackwaterZachary H Raylin Winer, PA-C   BP 128/78  Pulse 86  Temp(Src) 97.7 F (36.5 C) (Oral)  Resp 16  SpO2 97% Physical Exam  Nursing note and vitals reviewed. Constitutional: She is oriented to person, place, and time. Vital signs are normal. She appears well-developed and well-nourished. No distress.  HENT:  Head: Normocephalic and atraumatic.  Right Ear: Tympanic membrane, external ear and ear canal normal.  Left Ear: Tympanic membrane, external ear and ear canal normal.  Nose: Nose normal. Right sinus exhibits no maxillary sinus tenderness and no frontal sinus tenderness. Left sinus exhibits no maxillary sinus tenderness and no frontal sinus tenderness.  Mouth/Throat: Uvula is midline, oropharynx is clear and moist and  mucous membranes are normal.  Neck:    Cardiovascular: Normal rate, regular rhythm and normal heart sounds.   Pulmonary/Chest: Effort normal and breath sounds normal. No respiratory distress.  Neurological: She is alert and oriented to person, place, and time. She has normal strength. Coordination normal.  Skin: Skin is warm and dry. No rash noted. She is not diaphoretic.  Psychiatric: She has a normal mood and affect. Judgment normal.    ED Course  Procedures (including critical care time) Labs Review Labs Reviewed  TSH  POCT RAPID STREP A ([facility] ONLY)    Imaging Review No  results found.   MDM   1. Pharyngitis    Very low likelihood but possibility of thyroiditis, she is on Synthroid, we will send TSH. Otherwise treat symptomatically for viral pharyngitis.  Meds ordered this encounter  Medications  . NORCO 5-325 MG per tablet    Sig: Take 0.5-1 tablets by mouth every 6 (six) hours as needed for moderate pain.    Dispense:  10 tablet    Refill:  0    Order Specific Question:  Supervising Provider    Answer:  Clementeen GrahamOREY, EVAN, Kathie RhodesS [[id number]]        Graylon GoodZachary H Prathik Aman, PA-C 10/11/14 1002

## 2014-10-11 NOTE — ED Notes (Signed)
C/o ST onset 3 days Sx include swelling left side submandibular, odynophagia, productive cough Denies f/v/n/d Alert, no signs of acute distress.

## 2014-10-11 NOTE — ED Provider Notes (Signed)
Medical screening examination/treatment/procedure(s) were performed by a resident physician or non-physician practitioner and as the supervising physician I was immediately available for consultation/collaboration.  Clementeen GrahamEvan Chanteria Haggard, MD   Rodolph BongEvan S Sydnee Lamour, MD 10/11/14 [phone]

## 2014-10-11 NOTE — Discharge Instructions (Signed)

## 2014-10-13 LAB — CULTURE, GROUP A STREP
